# Patient Record
Sex: Male | Born: 1935 | Race: White | Hispanic: No | Marital: Married | Smoking: Never smoker
Health system: Southern US, Community
[De-identification: ages and names within clinical notes are randomized; demographics above are authoritative.]

## PROBLEM LIST (undated history)

## (undated) DIAGNOSIS — D696 Thrombocytopenia, unspecified: Secondary | ICD-10-CM

## (undated) DIAGNOSIS — K219 Gastro-esophageal reflux disease without esophagitis: Secondary | ICD-10-CM

## (undated) DIAGNOSIS — J340 Abscess, furuncle and carbuncle of nose: Secondary | ICD-10-CM

## (undated) DIAGNOSIS — N400 Enlarged prostate without lower urinary tract symptoms: Secondary | ICD-10-CM

## (undated) DIAGNOSIS — E785 Hyperlipidemia, unspecified: Secondary | ICD-10-CM

## (undated) DIAGNOSIS — I10 Essential (primary) hypertension: Secondary | ICD-10-CM

## (undated) HISTORY — DX: Benign prostatic hyperplasia without lower urinary tract symptoms: N40.0

## (undated) HISTORY — DX: Abscess, furuncle and carbuncle of nose: J34.0

## (undated) HISTORY — DX: Hyperlipidemia, unspecified: E78.5

## (undated) HISTORY — DX: Thrombocytopenia, unspecified: D69.6

## (undated) HISTORY — DX: Essential (primary) hypertension: I10

## (undated) HISTORY — DX: Gastro-esophageal reflux disease without esophagitis: K21.9

---

## 2003-09-24 ENCOUNTER — Ambulatory Visit (HOSPITAL_COMMUNITY): Admission: RE | Admit: 2003-09-24 | Discharge: 2003-09-24 | Payer: Self-pay | Admitting: *Deleted

## 2003-09-24 ENCOUNTER — Encounter (INDEPENDENT_AMBULATORY_CARE_PROVIDER_SITE_OTHER): Payer: Self-pay | Admitting: *Deleted

## 2006-12-27 ENCOUNTER — Emergency Department (HOSPITAL_COMMUNITY): Admission: EM | Admit: 2006-12-27 | Discharge: 2006-12-27 | Payer: Self-pay | Admitting: Emergency Medicine

## 2008-01-28 ENCOUNTER — Encounter: Admission: RE | Admit: 2008-01-28 | Discharge: 2008-01-28 | Payer: Self-pay | Admitting: General Surgery

## 2008-01-31 ENCOUNTER — Encounter (HOSPITAL_BASED_OUTPATIENT_CLINIC_OR_DEPARTMENT_OTHER): Payer: Self-pay | Admitting: General Surgery

## 2008-01-31 ENCOUNTER — Ambulatory Visit (HOSPITAL_BASED_OUTPATIENT_CLINIC_OR_DEPARTMENT_OTHER): Admission: RE | Admit: 2008-01-31 | Discharge: 2008-01-31 | Payer: Self-pay | Admitting: General Surgery

## 2011-03-28 NOTE — Op Note (Signed)
Robbins, Jimmy                  ACCOUNT NO.:  [id number]   MEDICAL RECORD NO.:  [id number]          PATIENT TYPE:  AMB   LOCATION:  DSC                          FACILITY:  [facility]   PHYSICIAN:  Leonie Man, M.D.   DATE OF BIRTH:  1921-08-29   DATE OF PROCEDURE:  01/31/2008  DATE OF DISCHARGE:                               OPERATIVE REPORT   PREOPERATIVE DIAGNOSIS:  Basal cell carcinoma of right frontal parietal  scalp.   POSTOPERATIVE DIAGNOSIS:  Basal cell carcinoma of right frontal parietal  scalp.   PROCEDURE:  Excision basal cell carcinoma of right frontal parietal  scalp.   SURGEON:  Leonie Man, M.D.   ASSISTANT:  OR nurse.   ANESTHESIA:  General.   SPECIMENS TO LAB:  Scalp skin with lesion, long suture marking anterior  margin, double suture marking lateral margin.   ESTIMATED BLOOD LOSS:  minimal.   COMPLICATIONS:  None.  The patient returned to the PACU in excellent  condition.   Mr. Mehtaab Mayeda is an 77 year old gentleman who has had multiple basal  cell carcinomas in the past.  He comes in now with a rather large basal  cell carcinoma which was biopsied with close margins.  The tumor  measures 2 cm in diameter.  He comes to the operating room now after the  risks and potential benefits of surgery have been fully discussed.  All  questions answered and consent obtained.   PROCEDURE:  The patient is positioned supinely and the head and scalp  were prepped and draped to be included in a sterile operative field.  Positive identification of the patient as Kahlel Peake and the area to be  removed as right frontal parietal scalp lesion is done.  I infiltrated  the region under the lesion with 1% lidocaine with epinephrine.  I then  made a transversely placed elliptical incision leaving approximately a 2  mm margin around the closest approach of the tumor.  I deepened this  down to the calvarium and removing the entire ellipse of skin with  electrocautery.  The  margins were marked as noted above and hemostasis  was then obtained with electrocautery.  I raised skin flaps anteriorly  toward the brow and posteriorly over the parietal area to effect tension-  free closure, then closed the skin with three retention sutures of 3-0  nylon and then a running suture of 4-0 Monocryl in the dermis.  I  placed Dermabond over this and then steri-stripped the wound in place.  A sterile dressing was applied.  The anesthetic reversed and the patient  removed from the operating room to the recovery room in stable  condition.  He tolerated the procedure well.      Leonie Man, M.D.  Electronically Signed     PB/MEDQ  D:  01/31/2008  T:  01/31/2008  Job:  [id number]

## 2011-08-07 LAB — DIFFERENTIAL
Basophils Absolute: 0
Basophils Relative: 1
Eosinophils Absolute: 0.3
Eosinophils Relative: 5
Lymphocytes Relative: 26
Lymphs Abs: 1.5
Monocytes Absolute: 0.6
Monocytes Relative: 11
Neutro Abs: 3.3
Neutrophils Relative %: 58

## 2011-08-07 LAB — COMPREHENSIVE METABOLIC PANEL
ALT: 31
AST: 28
Albumin: 3.8
Alkaline Phosphatase: 62
BUN: 15
CO2: 24
Calcium: 9.1
Chloride: 106
Creatinine, Ser: 1.18
GFR calc Af Amer: >60
GFR calc non Af Amer: 59 — ABNORMAL LOW
Glucose, Bld: 103 — ABNORMAL HIGH
Potassium: 4
Sodium: 138
Total Bilirubin: 0.9
Total Protein: 6.3

## 2011-08-07 LAB — CBC
HCT: 46.6
Hemoglobin: 15.9
MCHC: 34.2
MCV: 92.6
Platelets: 134 — ABNORMAL LOW
RBC: 5.03
RDW: 14
WBC: 5.7

## 2011-08-21 ENCOUNTER — Encounter: Payer: Self-pay | Admitting: Oncology

## 2011-08-22 ENCOUNTER — Other Ambulatory Visit: Payer: Self-pay | Admitting: Oncology

## 2011-08-22 ENCOUNTER — Encounter (HOSPITAL_BASED_OUTPATIENT_CLINIC_OR_DEPARTMENT_OTHER): Payer: Medicare Other | Admitting: Oncology

## 2011-08-22 DIAGNOSIS — D696 Thrombocytopenia, unspecified: Secondary | ICD-10-CM

## 2011-08-22 DIAGNOSIS — N400 Enlarged prostate without lower urinary tract symptoms: Secondary | ICD-10-CM

## 2011-08-22 DIAGNOSIS — E785 Hyperlipidemia, unspecified: Secondary | ICD-10-CM

## 2011-08-22 LAB — CBC WITH DIFFERENTIAL/PLATELET
BASO%: 0.4 % (ref 0.0–2.0)
Basophils Absolute: 0 10*3/uL (ref 0.0–0.1)
EOS%: 3 % (ref 0.0–7.0)
Eosinophils Absolute: 0.2 10*3/uL (ref 0.0–0.5)
LYMPH%: 24.5 % (ref 14.0–49.0)
MCH: 31.6 pg (ref 27.2–33.4)
MONO%: 9.2 % (ref 0.0–14.0)
NEUT%: 62.9 % (ref 39.0–75.0)
lymph#: 1.6 10*3/uL (ref 0.9–3.3)

## 2011-08-22 LAB — MORPHOLOGY: PLT EST: DECREASED

## 2011-08-24 ENCOUNTER — Encounter: Payer: Self-pay | Admitting: Oncology

## 2011-08-24 DIAGNOSIS — D696 Thrombocytopenia, unspecified: Secondary | ICD-10-CM | POA: Insufficient documentation

## 2011-08-24 LAB — PROTEIN ELECTROPHORESIS, SERUM
Albumin ELP: 62.7 % (ref 55.8–66.1)
Total Protein, Serum Electrophoresis: 7 g/dL (ref 6.0–8.3)

## 2011-08-24 LAB — COMPREHENSIVE METABOLIC PANEL
ALT: 19 U/L (ref 0–53)
Albumin: 4.7 g/dL (ref 3.5–5.2)
CO2: 24 mEq/L (ref 19–32)
Calcium: 9.2 mg/dL (ref 8.4–10.5)
Chloride: 108 mEq/L (ref 96–112)
Glucose, Bld: 81 mg/dL (ref 70–99)
Sodium: 144 mEq/L (ref 135–145)
Total Protein: 7 g/dL (ref 6.0–8.3)

## 2011-08-24 LAB — KAPPA/LAMBDA LIGHT CHAINS
Kappa free light chain: 1.41 mg/dL (ref 0.33–1.94)
Kappa:Lambda Ratio: 1.41 (ref 0.26–1.65)
Lambda Free Lght Chn: 1 mg/dL (ref 0.57–2.63)

## 2011-08-24 LAB — ANA: Anti Nuclear Antibody(ANA): NEGATIVE

## 2011-08-29 ENCOUNTER — Other Ambulatory Visit: Payer: Self-pay | Admitting: Oncology

## 2011-08-29 ENCOUNTER — Encounter (HOSPITAL_BASED_OUTPATIENT_CLINIC_OR_DEPARTMENT_OTHER): Payer: Medicare Other | Admitting: Oncology

## 2011-08-29 ENCOUNTER — Other Ambulatory Visit (HOSPITAL_COMMUNITY)
Admission: RE | Admit: 2011-08-29 | Discharge: 2011-08-29 | Disposition: A | Discharge status: Home / Self Care | Payer: Medicare Other | Source: Ambulatory Visit | Attending: Oncology | Admitting: Oncology

## 2011-08-29 DIAGNOSIS — D696 Thrombocytopenia, unspecified: Secondary | ICD-10-CM | POA: Insufficient documentation

## 2011-09-08 ENCOUNTER — Encounter: Payer: Self-pay | Admitting: *Deleted

## 2011-09-15 ENCOUNTER — Encounter: Payer: Self-pay | Admitting: Oncology

## 2011-09-15 DIAGNOSIS — K219 Gastro-esophageal reflux disease without esophagitis: Secondary | ICD-10-CM | POA: Insufficient documentation

## 2011-09-15 DIAGNOSIS — N400 Enlarged prostate without lower urinary tract symptoms: Secondary | ICD-10-CM | POA: Insufficient documentation

## 2011-09-15 DIAGNOSIS — I10 Essential (primary) hypertension: Secondary | ICD-10-CM | POA: Insufficient documentation

## 2011-09-15 DIAGNOSIS — E785 Hyperlipidemia, unspecified: Secondary | ICD-10-CM | POA: Insufficient documentation

## 2011-09-27 ENCOUNTER — Telehealth: Payer: Self-pay | Admitting: Oncology

## 2011-09-27 ENCOUNTER — Other Ambulatory Visit: Payer: Self-pay | Admitting: Oncology

## 2011-09-27 ENCOUNTER — Other Ambulatory Visit (HOSPITAL_BASED_OUTPATIENT_CLINIC_OR_DEPARTMENT_OTHER): Payer: Medicare Other

## 2011-09-27 ENCOUNTER — Ambulatory Visit (HOSPITAL_BASED_OUTPATIENT_CLINIC_OR_DEPARTMENT_OTHER): Payer: Medicare Other | Admitting: Oncology

## 2011-09-27 DIAGNOSIS — D696 Thrombocytopenia, unspecified: Secondary | ICD-10-CM

## 2011-09-27 DIAGNOSIS — M549 Dorsalgia, unspecified: Secondary | ICD-10-CM

## 2011-09-27 DIAGNOSIS — N400 Enlarged prostate without lower urinary tract symptoms: Secondary | ICD-10-CM

## 2011-09-27 LAB — CBC WITH DIFFERENTIAL/PLATELET
BASO%: 0.6 % (ref 0.0–2.0)
EOS%: 2.3 % (ref 0.0–7.0)
HCT: 44.6 % (ref 38.4–49.9)
LYMPH%: 26.9 % (ref 14.0–49.0)
MCH: 31.3 pg (ref 27.2–33.4)
MCHC: 33.8 g/dL (ref 32.0–36.0)
MCV: 92.6 fL (ref 79.3–98.0)
MONO#: 0.7 10*3/uL (ref 0.1–0.9)
MONO%: 11.3 % (ref 0.0–14.0)
NEUT%: 58.9 % (ref 39.0–75.0)
Platelets: 144 10*3/uL (ref 140–400)
RBC: 4.82 10*6/uL (ref 4.20–5.82)

## 2011-09-27 NOTE — Progress Notes (Signed)
[facility] OFFICE PROGRESS NOTE  CC:  Juluis Rainier, M.D.  DIAGNOSIS:  Intermittent thrombocytopenia.  CURRENT THERAPY: watchful observation.  INTERVAL HISTORY: Jimmy Robbins 77 y.o. male returns for routine follow up.  Dr. Bailey Mech feeling well. He has chronic low back pain that has been on for many years. The back pain is worse in the morning when he first wakes up and improves as the day goes on. He denies any bowel or bladder incontinence, lower extremity weakness, or paresthesia. He denies any bleeding symptoms petechiae skin rash B. symptoms weight loss, anorexia, shortness of breath, bleeding symptom.  He still is able to drive and is very active with activities of daily living.   MEDICAL HISTORY: Past Medical History  Diagnosis Date  . Thrombocytopenia   . BPH (benign prostatic hyperplasia)   . HTN (hypertension)   . GERD (gastroesophageal reflux disease)   . Hyperlipidemia     ALLERGIES:   has no known allergies.  MEDICATIONS: Current Outpatient Prescriptions  Medication Sig Dispense Refill  . amLODipine (NORVASC) 5 MG tablet Take 5 mg by mouth daily.        . B Complex-C (B-COMPLEX WITH VITAMIN C) tablet Take 1 tablet by mouth daily.        . finasteride (PROSCAR) 5 MG tablet Take 5 mg by mouth daily.        . folic acid (FOLVITE) 1 MG tablet Take 1 mg by mouth daily.        Marland Kitchen lovastatin (MEVACOR) 20 MG tablet Take 20 mg by mouth at bedtime.        . Multiple Vitamin (MULTIVITAMIN) tablet Take 1 tablet by mouth daily.        . multivitamin-lutein (OCUVITE-LUTEIN) CAPS Take 1 capsule by mouth daily.        Marland Kitchen omeprazole (PRILOSEC) 20 MG capsule Take 20 mg by mouth daily.        . Tamsulosin HCl (FLOMAX) 0.4 MG CAPS Take 0.4 mg by mouth daily.          SURGICAL HISTORY: No past surgical history on file.  REVIEW OF SYSTEMS:  Pertinent items are noted in HPI.   Filed Vitals:   09/27/11 1157  BP: 160/79  Pulse: 82  Temp: 97.1 F (36.2 C)   Wt  Readings from Last 3 Encounters:  09/27/11 171 lb 11.2 oz (77.883 kg)  08/22/11 174 lb 4.8 oz (79.062 kg)    PHYSICAL EXAMINATION:  General:  well-nourished in no acute distress.  Eyes:  no scleral icterus.  ENT:  There were no oropharyngeal lesions.  Neck was without thyromegaly.  Lymphatics:  Negative cervical, supraclavicular or axillary adenopathy.  Respiratory: lungs were clear bilaterally without wheezing or crackles.  Cardiovascular:  Regular rate and rhythm, S1/S2, without murmur, rub or gallop.  There was no pedal edema.  GI:  abdomen was soft, flat, nontender, nondistended, without organomegaly.  Muscoloskeletal:  no spinal tenderness of palpation of vertebral spine.  Skin exam was without echymosis, petichae.  Neuro exam was nonfocal.  Patient needed minimal help to get on and off exam table due to his chronic lower back pain.  Gait was normal.  Patient was alerted and oriented.  Attention was good.   Language was appropriate.  Mood was normal without depression.  Speech was not pressured.  Thought content was not tangential.    LABORATORY/RADIOLOGY DATA: WBC 6.1; ANC 3.6; Hgb 15.1; Plt 144.  Bone marrow aspirate and core biopsy on 08/29/2011 showed:  Bone Marrow, Aspirate,Biopsy, and Clot, left iliac crest :   - HYPERCELLULAR BONE MARROW FOR AGE WITH TRILINEAGE HEMATOPOIESIS INCLUDING   ABUNDANT   MEGAKARYOCYTES.   - SEE COMMENT.   PERIPHERAL BLOOD:   - THROMBOCYTOPENIA.  CYTOGENTICS:  Normal   ASSESSMENT AND PLAN:   1. Hyperlipidemia.   on lovastatin per PCP.  2. Benign prostatic hypertrophy.  He is on finasteride. 3. Intermittent thrombocytopenia: This is resolved today. His bone marrow biopsy including cytogenics were negative for myeloproliferative disease or lymphoproliferative disease or MDS.  Thus, he either had mild ITP or medication-induced thrombocytopenia.   4. Follow up:  Lab only with the [facility] in Feb 2013, May 2013.  I'll see him in August 2013.

## 2011-09-27 NOTE — Telephone Encounter (Signed)
gve the pt his feb,may,aug 2012 appt calendar

## 2011-10-14 ENCOUNTER — Other Ambulatory Visit: Payer: Self-pay | Admitting: Oncology

## 2011-10-16 ENCOUNTER — Telehealth: Payer: Self-pay | Admitting: Oncology

## 2011-10-16 ENCOUNTER — Other Ambulatory Visit: Payer: Medicare Other | Admitting: Lab

## 2011-10-16 NOTE — Telephone Encounter (Signed)
lmonvm advising the pt that his appts for 07/10/2012 has been r/s to 06/17/2012 due to the md's schedule

## 2011-10-28 ENCOUNTER — Ambulatory Visit: Payer: Medicare Other

## 2011-12-25 ENCOUNTER — Other Ambulatory Visit (HOSPITAL_BASED_OUTPATIENT_CLINIC_OR_DEPARTMENT_OTHER): Payer: Medicare Other | Admitting: Lab

## 2011-12-25 ENCOUNTER — Telehealth: Payer: Self-pay | Admitting: *Deleted

## 2011-12-25 ENCOUNTER — Other Ambulatory Visit: Payer: Self-pay | Admitting: *Deleted

## 2011-12-25 DIAGNOSIS — D696 Thrombocytopenia, unspecified: Secondary | ICD-10-CM

## 2011-12-25 LAB — CBC WITH DIFFERENTIAL/PLATELET
BASO%: 0.4 % (ref 0.0–2.0)
EOS%: 3.4 % (ref 0.0–7.0)
HCT: 43.7 % (ref 38.4–49.9)
MCH: 31.1 pg (ref 27.2–33.4)
MCHC: 33.9 g/dL (ref 32.0–36.0)
MONO#: 0.7 10*3/uL (ref 0.1–0.9)
NEUT%: 57.6 % (ref 39.0–75.0)
RBC: 4.76 10*6/uL (ref 4.20–5.82)
WBC: 6.1 10*3/uL (ref 4.0–10.3)
lymph#: 1.7 10*3/uL (ref 0.9–3.3)

## 2011-12-25 NOTE — Telephone Encounter (Signed)
Called pt w/ results of his Platelets and relayed Dr. Lodema Pilot message that they are stable and to continue observation.  Pt verbalized understanding and states next lab appt in May.

## 2011-12-25 NOTE — Telephone Encounter (Signed)
Message copied by Wende Mott on Mon Dec 25, 2011 11:23 AM ------      Message from: HA, Raliegh Ip T      Created: Mon Dec 25, 2011  9:58 AM       Please call pt.  His plt is low but stable.  Continue observation.  Thanks.

## 2012-03-25 ENCOUNTER — Other Ambulatory Visit (HOSPITAL_BASED_OUTPATIENT_CLINIC_OR_DEPARTMENT_OTHER): Payer: Medicare Other | Admitting: Lab

## 2012-03-25 ENCOUNTER — Telehealth: Payer: Self-pay | Admitting: *Deleted

## 2012-03-25 DIAGNOSIS — D696 Thrombocytopenia, unspecified: Secondary | ICD-10-CM

## 2012-03-25 LAB — CBC WITH DIFFERENTIAL/PLATELET
BASO%: 0.7 % (ref 0.0–2.0)
Basophils Absolute: 0 10*3/uL (ref 0.0–0.1)
EOS%: 3.1 % (ref 0.0–7.0)
HGB: 14.8 g/dL (ref 13.0–17.1)
MCH: 31.4 pg (ref 27.2–33.4)
MCHC: 33.9 g/dL (ref 32.0–36.0)
MONO#: 0.8 10*3/uL (ref 0.1–0.9)
RDW: 14 % (ref 11.0–14.6)
WBC: 6.2 10*3/uL (ref 4.0–10.3)
lymph#: 1.7 10*3/uL (ref 0.9–3.3)

## 2012-03-25 NOTE — Telephone Encounter (Signed)
Message copied by Wende Mott on Mon Mar 25, 2012 12:04 PM ------      Message from: HA, Raliegh Ip T      Created: Mon Mar 25, 2012 10:33 AM       Please call pt.  His thrombocytopenia is stable.  Continue observation.  Thanks.

## 2012-03-25 NOTE — Telephone Encounter (Signed)
Called pt w/ results of Platelet count and they are stable per Dr. Gaylyn Rong.  Keep next appt as scheduled in August.  Pt verbalized understanding and states he "is happy w/ the results. "

## 2012-06-17 ENCOUNTER — Ambulatory Visit: Payer: Medicare Other | Admitting: Oncology

## 2012-06-17 ENCOUNTER — Other Ambulatory Visit: Payer: Medicare Other | Admitting: Lab

## 2012-06-26 ENCOUNTER — Telehealth: Payer: Self-pay | Admitting: Oncology

## 2012-06-26 ENCOUNTER — Ambulatory Visit (HOSPITAL_BASED_OUTPATIENT_CLINIC_OR_DEPARTMENT_OTHER): Payer: Medicare Other | Admitting: Oncology

## 2012-06-26 ENCOUNTER — Other Ambulatory Visit (HOSPITAL_BASED_OUTPATIENT_CLINIC_OR_DEPARTMENT_OTHER): Payer: Medicare Other

## 2012-06-26 ENCOUNTER — Encounter: Payer: Self-pay | Admitting: Oncology

## 2012-06-26 VITALS — BP 145/72 | HR 68 | Temp 96.7°F | Resp 20 | Ht 64.5 in | Wt 176.1 lb

## 2012-06-26 DIAGNOSIS — I10 Essential (primary) hypertension: Secondary | ICD-10-CM

## 2012-06-26 DIAGNOSIS — N400 Enlarged prostate without lower urinary tract symptoms: Secondary | ICD-10-CM

## 2012-06-26 DIAGNOSIS — J3489 Other specified disorders of nose and nasal sinuses: Secondary | ICD-10-CM

## 2012-06-26 DIAGNOSIS — D696 Thrombocytopenia, unspecified: Secondary | ICD-10-CM

## 2012-06-26 DIAGNOSIS — J340 Abscess, furuncle and carbuncle of nose: Secondary | ICD-10-CM | POA: Insufficient documentation

## 2012-06-26 HISTORY — DX: Abscess, furuncle and carbuncle of nose: J34.0

## 2012-06-26 LAB — CBC WITH DIFFERENTIAL/PLATELET
BASO%: 0.8 % (ref 0.0–2.0)
Eosinophils Absolute: 0.2 10*3/uL (ref 0.0–0.5)
HCT: 44.5 % (ref 38.4–49.9)
LYMPH%: 27.1 % (ref 14.0–49.0)
MCHC: 33.4 g/dL (ref 32.0–36.0)
MONO#: 0.8 10*3/uL (ref 0.1–0.9)
NEUT#: 3.5 10*3/uL (ref 1.5–6.5)
NEUT%: 55.6 % (ref 39.0–75.0)
Platelets: 98 10*3/uL — ABNORMAL LOW (ref 140–400)
RBC: 4.77 10*6/uL (ref 4.20–5.82)
WBC: 6.4 10*3/uL (ref 4.0–10.3)
lymph#: 1.7 10*3/uL (ref 0.9–3.3)

## 2012-06-26 LAB — COMPREHENSIVE METABOLIC PANEL
ALT: 16 U/L (ref 0–53)
CO2: 25 mEq/L (ref 19–32)
Calcium: 9 mg/dL (ref 8.4–10.5)
Chloride: 105 mEq/L (ref 96–112)
Glucose, Bld: 91 mg/dL (ref 70–99)
Sodium: 137 mEq/L (ref 135–145)
Total Bilirubin: 0.8 mg/dL (ref 0.3–1.2)
Total Protein: 6.4 g/dL (ref 6.0–8.3)

## 2012-06-26 NOTE — Telephone Encounter (Signed)
gv pt appt schedule for December 2013 and April/August 2014. Pt also given appt for 10/16 @ 11 am w/Dr. Sharyn Lull.

## 2012-06-26 NOTE — Patient Instructions (Addendum)
1.  Diagnosis:  Mild chronic thrombocytopenia.   2.  Cause:  Unknown; negative work up so far including bone marrow biopsy.  3.  Treatment:  observation since chance of spontaneous bleeding is low when platelet count is more than 30.  4.  Follow up:  Lab-only appointment at the [facility] in about 4 and then 8 months.  Return visit in about 1 year.

## 2012-07-02 NOTE — Progress Notes (Signed)
[facility]  Telephone:([phone] Fax:([phone]) [phone]   OFFICE PROGRESS NOTE   Cc:  Gaye Alken, MD   DIAGNOSIS: Intermittent thrombocytopenia; most likely from medication or chronic mild ITP.  Bone marrow biopsy in 08/2011 was negative for MDS.   CURRENT THERAPY: watchful observation.  INTERVAL HISTORY: Jimmy Robbins 78 y.o. male returns for regular follow up. He has bilateral knee pain from what he thought was due to OA.  He takes over the counter pain meds with improvement.  Pain and stiffness are worst in the morning and improved with activities.  He also has a nonhealing ulcer on the tip of his nose for the last few months.  He denied any obvious skin rash elsewhere.  He has mild fatigue; however, he is still independent of activities of daily living and was able to drive to clinic today.   Patient denies fever, anorexia, weight loss, headache, visual changes, confusion, drenching night sweats, palpable lymph node swelling, mucositis, odynophagia, dysphagia, nausea vomiting, jaundice, chest pain, palpitation, shortness of breath, dyspnea on exertion, productive cough, gum bleeding, epistaxis, hematemesis, hemoptysis, abdominal pain, abdominal swelling, early satiety, melena, hematochezia, hematuria, spontaneous bleeding, joint swelling, heat or cold intolerance, bowel bladder incontinence, back pain, focal motor weakness, paresthesia, depression, suicidal or homicidal ideation, feeling hopelessness.   Past Medical History  Diagnosis Date  . Thrombocytopenia   . BPH (benign prostatic hyperplasia)   . HTN (hypertension)   . GERD (gastroesophageal reflux disease)   . Hyperlipidemia   . Nose ulceration 06/26/2012    No past surgical history on file.  Current Outpatient Prescriptions  Medication Sig Dispense Refill  . amLODipine (NORVASC) 5 MG tablet Take 5 mg by mouth daily.        . B Complex-C (B-COMPLEX WITH VITAMIN C) tablet Take 1 tablet by mouth  daily.        . finasteride (PROSCAR) 5 MG tablet Take 5 mg by mouth daily.        . folic acid (FOLVITE) 1 MG tablet Take 1 mg by mouth daily.        Marland Kitchen lovastatin (MEVACOR) 20 MG tablet Take 20 mg by mouth at bedtime.        . Multiple Vitamin (MULTIVITAMIN) tablet Take 1 tablet by mouth daily.        . multivitamin-lutein (OCUVITE-LUTEIN) CAPS Take 1 capsule by mouth daily.        Marland Kitchen omeprazole (PRILOSEC) 20 MG capsule Take 20 mg by mouth daily.        . Tamsulosin HCl (FLOMAX) 0.4 MG CAPS Take 0.4 mg by mouth daily.          ALLERGIES:   has no known allergies.  REVIEW OF SYSTEMS:  The rest of the 14-point review of system was negative.   Filed Vitals:   06/26/12 1111  BP: 145/72  Pulse: 68  Temp: 96.7 F (35.9 C)  Resp: 20   Wt Readings from Last 3 Encounters:  06/26/12 176 lb 1.6 oz (79.878 kg)  09/27/11 171 lb 11.2 oz (77.883 kg)  08/22/11 174 lb 4.8 oz (79.062 kg)   ECOG Performance status: 1  PHYSICAL EXAMINATION:   General:  well-nourished man in no acute distress.  Eyes:  no scleral icterus.  ENT:  There were no oropharyngeal lesions.  There as a 0.5cm ulceration at the tip of his nose without discoloration, purulent discharge.  Neck was without thyromegaly.  Lymphatics:  Negative cervical, supraclavicular or axillary adenopathy.  Respiratory: lungs were clear bilaterally without wheezing or crackles.  Cardiovascular:  Regular rate and rhythm, S1/S2, without murmur, rub or gallop.  There was no pedal edema.  GI:  abdomen was soft, flat, nontender, nondistended, without organomegaly.  Muscoloskeletal:  no spinal tenderness of palpation of vertebral spine.  Neuro exam was nonfocal.  Patient was able to get on and off exam table without assistance.  Gait was normal.  Patient was alerted and oriented.  Attention was good.   Language was appropriate.  Mood was normal without depression.  Speech was not pressured.  Thought content was not tangential.     LABORATORY/RADIOLOGY  DATA:  Lab Results  Component Value Date   WBC 6.4 06/26/2012   HGB 14.9 06/26/2012   HCT 44.5 06/26/2012   PLT 98* 06/26/2012   GLUCOSE 91 06/26/2012   ALKPHOS 67 06/26/2012   ALT 16 06/26/2012   AST 18 06/26/2012   NA 137 06/26/2012   K 4.3 06/26/2012   CL 105 06/26/2012   CREATININE 1.07 06/26/2012   BUN 19 06/26/2012   CO2 25 06/26/2012     ASSESSMENT AND PLAN:   1. Hyperlipidemia. on lovastatin per PCP.  2. Benign prostatic hypertrophy. He is on finasteride and tamsulosin per PCP. 3. Intermittent thrombocytopenia:  His bone marrow biopsy including cytogenics were negative for myeloproliferative disease or lymphoproliferative disease or MDS. Thus, he either had mild ITP or medication-induced thrombocytopenia.  There is low risk of spontaneous bleeding with platelet >30.  [name] expressed informed understanding and agreed with watchful observation.  4. Nose ulceration:  Need to rule out basal cell carcinoma.  I referred him to a local Dermatologist.  5. Follow up:  Lab-only appointment at the [facility] in about 4 and then 8 months.  Return visit in about 1 year.    The length of time of the face-to-face encounter was 15  minutes. More than 50% of time was spent counseling and coordination of care.

## 2012-07-10 ENCOUNTER — Ambulatory Visit: Payer: Medicare Other | Admitting: Oncology

## 2012-07-10 ENCOUNTER — Other Ambulatory Visit: Payer: Medicare Other | Admitting: Lab

## 2012-10-23 ENCOUNTER — Other Ambulatory Visit: Payer: Medicare Other | Admitting: Lab

## 2012-10-30 ENCOUNTER — Other Ambulatory Visit (HOSPITAL_BASED_OUTPATIENT_CLINIC_OR_DEPARTMENT_OTHER): Payer: Medicare Other

## 2012-10-30 ENCOUNTER — Telehealth: Payer: Self-pay

## 2012-10-30 DIAGNOSIS — J340 Abscess, furuncle and carbuncle of nose: Secondary | ICD-10-CM

## 2012-10-30 DIAGNOSIS — I10 Essential (primary) hypertension: Secondary | ICD-10-CM

## 2012-10-30 DIAGNOSIS — J3489 Other specified disorders of nose and nasal sinuses: Secondary | ICD-10-CM

## 2012-10-30 DIAGNOSIS — D696 Thrombocytopenia, unspecified: Secondary | ICD-10-CM

## 2012-10-30 LAB — CBC WITH DIFFERENTIAL/PLATELET
BASO%: 1 % (ref 0.0–2.0)
Eosinophils Absolute: 0.2 10*3/uL (ref 0.0–0.5)
MCHC: 34 g/dL (ref 32.0–36.0)
MONO#: 0.7 10*3/uL (ref 0.1–0.9)
NEUT#: 3.1 10*3/uL (ref 1.5–6.5)
RBC: 4.74 10*6/uL (ref 4.20–5.82)
RDW: 14.1 % (ref 11.0–14.6)
WBC: 5.6 10*3/uL (ref 4.0–10.3)
lymph#: 1.6 10*3/uL (ref 0.9–3.3)

## 2012-10-30 NOTE — Telephone Encounter (Signed)
Message copied by Kallie Locks on Wed Oct 30, 2012  4:03 PM ------      Message from: HA, Raliegh Ip T      Created: Wed Oct 30, 2012  9:54 AM       Please call pt.  Please inform him that his thrombocytopenia is stable.  Continue observation.  Thanks.

## 2013-02-19 ENCOUNTER — Other Ambulatory Visit (HOSPITAL_BASED_OUTPATIENT_CLINIC_OR_DEPARTMENT_OTHER): Payer: Medicare Other | Admitting: Lab

## 2013-02-19 ENCOUNTER — Encounter: Payer: Self-pay | Admitting: Oncology

## 2013-02-19 DIAGNOSIS — D696 Thrombocytopenia, unspecified: Secondary | ICD-10-CM

## 2013-02-19 DIAGNOSIS — J340 Abscess, furuncle and carbuncle of nose: Secondary | ICD-10-CM

## 2013-02-19 DIAGNOSIS — I10 Essential (primary) hypertension: Secondary | ICD-10-CM

## 2013-02-19 LAB — CBC WITH DIFFERENTIAL/PLATELET
BASO%: 0.6 % (ref 0.0–2.0)
EOS%: 3.2 % (ref 0.0–7.0)
HCT: 44.6 % (ref 38.4–49.9)
LYMPH%: 25.4 % (ref 14.0–49.0)
MCH: 30.8 pg (ref 27.2–33.4)
MCHC: 34.1 g/dL (ref 32.0–36.0)
MONO%: 12.2 % (ref 0.0–14.0)
NEUT%: 58.6 % (ref 39.0–75.0)
Platelets: 83 10*3/uL — ABNORMAL LOW (ref 140–400)
RBC: 4.94 10*6/uL (ref 4.20–5.82)
WBC: 6.6 10*3/uL (ref 4.0–10.3)

## 2013-06-25 ENCOUNTER — Encounter: Payer: Self-pay | Admitting: Oncology

## 2013-06-25 ENCOUNTER — Ambulatory Visit (HOSPITAL_BASED_OUTPATIENT_CLINIC_OR_DEPARTMENT_OTHER): Payer: Medicare Other | Admitting: Oncology

## 2013-06-25 ENCOUNTER — Other Ambulatory Visit (HOSPITAL_BASED_OUTPATIENT_CLINIC_OR_DEPARTMENT_OTHER): Payer: Medicare Other | Admitting: Lab

## 2013-06-25 ENCOUNTER — Telehealth: Payer: Self-pay | Admitting: Oncology

## 2013-06-25 VITALS — BP 160/79 | HR 71 | Temp 97.0°F | Resp 18 | Ht 64.0 in | Wt 174.0 lb

## 2013-06-25 DIAGNOSIS — J340 Abscess, furuncle and carbuncle of nose: Secondary | ICD-10-CM

## 2013-06-25 DIAGNOSIS — D696 Thrombocytopenia, unspecified: Secondary | ICD-10-CM

## 2013-06-25 DIAGNOSIS — E785 Hyperlipidemia, unspecified: Secondary | ICD-10-CM

## 2013-06-25 DIAGNOSIS — I10 Essential (primary) hypertension: Secondary | ICD-10-CM

## 2013-06-25 LAB — CBC WITH DIFFERENTIAL/PLATELET
BASO%: 0.5 % (ref 0.0–2.0)
EOS%: 2.2 % (ref 0.0–7.0)
MCH: 31.3 pg (ref 27.2–33.4)
MCHC: 34.1 g/dL (ref 32.0–36.0)
MONO#: 0.8 10*3/uL (ref 0.1–0.9)
NEUT%: 61.6 % (ref 39.0–75.0)
RBC: 4.96 10*6/uL (ref 4.20–5.82)
RDW: 14 % (ref 11.0–14.6)
WBC: 6.4 10*3/uL (ref 4.0–10.3)
lymph#: 1.5 10*3/uL (ref 0.9–3.3)

## 2013-06-25 LAB — COMPREHENSIVE METABOLIC PANEL (CC13)
AST: 20 U/L (ref 5–34)
BUN: 15.4 mg/dL (ref 7.0–26.0)
Calcium: 9.1 mg/dL (ref 8.4–10.4)
Chloride: 107 mEq/L (ref 98–109)
Creatinine: 1.1 mg/dL (ref 0.7–1.3)
Total Bilirubin: 0.84 mg/dL (ref 0.20–1.20)

## 2013-06-25 LAB — MORPHOLOGY: PLT EST: DECREASED

## 2013-06-25 NOTE — Progress Notes (Signed)
[facility]  Telephone:(336) [phone] Fax:(336) [phone]   OFFICE PROGRESS NOTE   Cc:  Gaye Alken, MD   DIAGNOSIS: Intermittent thrombocytopenia; most likely from medication or chronic mild ITP.  Bone marrow biopsy in 08/2011 was negative for MDS.   CURRENT THERAPY: watchful observation.  INTERVAL HISTORY: Jimmy Robbins 79 y.o. male returns for regular follow up. He has bilateral knee pain from what he thought was due to OA.  He takes over the counter pain meds with improvement.  Pain and stiffness are worst in the morning and improved with activities.  He has mild fatigue; however, he is still independent of activities of daily living and was able to drive to clinic today.   Patient denies fever, anorexia, weight loss, headache, visual changes, confusion, drenching night sweats, palpable lymph node swelling, mucositis, odynophagia, dysphagia, nausea vomiting, jaundice, chest pain, palpitation, shortness of breath, dyspnea on exertion, productive cough, gum bleeding, epistaxis, hematemesis, hemoptysis, abdominal pain, abdominal swelling, early satiety, melena, hematochezia, hematuria, spontaneous bleeding, joint swelling, heat or cold intolerance, bowel bladder incontinence, back pain, focal motor weakness, paresthesia, depression, suicidal or homicidal ideation, feeling hopelessness.   Past Medical History  Diagnosis Date  . Thrombocytopenia   . BPH (benign prostatic hyperplasia)   . HTN (hypertension)   . GERD (gastroesophageal reflux disease)   . Hyperlipidemia   . Nose ulceration 06/26/2012    History reviewed. No pertinent past surgical history.  Current Outpatient Prescriptions  Medication Sig Dispense Refill  . amLODipine (NORVASC) 5 MG tablet Take 5 mg by mouth daily.        . B Complex-C (B-COMPLEX WITH VITAMIN C) tablet Take 1 tablet by mouth daily.        . finasteride (PROSCAR) 5 MG tablet Take 5 mg by mouth daily.        . folic acid  (FOLVITE) 1 MG tablet Take 1 mg by mouth daily.        Marland Kitchen lovastatin (MEVACOR) 20 MG tablet Take 20 mg by mouth at bedtime.        . Multiple Vitamin (MULTIVITAMIN) tablet Take 1 tablet by mouth daily.        . multivitamin-lutein (OCUVITE-LUTEIN) CAPS Take 1 capsule by mouth daily.        Marland Kitchen omeprazole (PRILOSEC) 20 MG capsule Take 20 mg by mouth daily.        . Tamsulosin HCl (FLOMAX) 0.4 MG CAPS Take 0.4 mg by mouth daily.         No current facility-administered medications for this visit.    ALLERGIES:  has No Known Allergies.  REVIEW OF SYSTEMS:  The rest of the 14-point review of system was negative.   Filed Vitals:   06/25/13 1113  BP: 160/79  Pulse: 71  Temp: 97 F (36.1 C)  Resp: 18   Wt Readings from Last 3 Encounters:  06/25/13 174 lb (78.926 kg)  06/26/12 176 lb 1.6 oz (79.878 kg)  09/27/11 171 lb 11.2 oz (77.883 kg)   ECOG Performance status: 1  PHYSICAL EXAMINATION:   General:  well-nourished man in no acute distress.  Eyes:  no scleral icterus.  ENT:  There were no oropharyngeal lesions.  Neck was without thyromegaly.  Lymphatics:  Negative cervical, supraclavicular or axillary adenopathy.  Respiratory: lungs were clear bilaterally without wheezing or crackles.  Cardiovascular:  Regular rate and rhythm, S1/S2, without murmur, rub or gallop.  There was no pedal edema.  GI:  abdomen was soft, flat,  nontender, nondistended, without organomegaly.  Muscoloskeletal:  no spinal tenderness of palpation of vertebral spine.  Neuro exam was nonfocal.  Patient was able to get on and off exam table without assistance.  Gait was normal.  Patient was alerted and oriented.  Attention was good.   Language was appropriate.  Mood was normal without depression.  Speech was not pressured.  Thought content was not tangential.     LABORATORY/RADIOLOGY DATA:  Lab Results  Component Value Date   WBC 6.4 06/25/2013   HGB 15.5 06/25/2013   HCT 45.6 06/25/2013   PLT 95* 06/25/2013   GLUCOSE  92 06/25/2013   ALKPHOS 75 06/25/2013   ALT 16 06/25/2013   AST 20 06/25/2013   NA 140 06/25/2013   K 4.4 06/25/2013   CL 105 06/26/2012   CREATININE 1.1 06/25/2013   BUN 15.4 06/25/2013   CO2 23 06/25/2013     ASSESSMENT AND PLAN:   1. Hyperlipidemia. on lovastatin per PCP.  2. Benign prostatic hypertrophy. He is on finasteride and tamsulosin per PCP. 3. Intermittent thrombocytopenia:  His bone marrow biopsy including cytogenics were negative for myeloproliferative disease or lymphoproliferative disease or MDS. Thus, he either had mild ITP or medication-induced thrombocytopenia.  There is low risk of spontaneous bleeding with platelet >30.  Mr. Clerk expressed informed understanding and agreed with watchful observation.   4. Follow up:  Lab-only appointment at the [facility] in about 4 and then 8 months.  Return visit in about 1 year.    The length of time of the face-to-face encounter was 15  minutes. More than 50% of time was spent counseling and coordination of care.

## 2013-06-25 NOTE — Telephone Encounter (Signed)
gv and printed appt sched and avs for pt  °

## 2013-10-15 ENCOUNTER — Other Ambulatory Visit: Payer: Self-pay | Admitting: Hematology and Oncology

## 2013-10-15 ENCOUNTER — Telehealth: Payer: Self-pay | Admitting: Hematology and Oncology

## 2013-10-15 ENCOUNTER — Other Ambulatory Visit (HOSPITAL_BASED_OUTPATIENT_CLINIC_OR_DEPARTMENT_OTHER): Payer: Medicare Other | Admitting: Lab

## 2013-10-15 ENCOUNTER — Telehealth: Payer: Self-pay | Admitting: *Deleted

## 2013-10-15 DIAGNOSIS — D696 Thrombocytopenia, unspecified: Secondary | ICD-10-CM

## 2013-10-15 LAB — COMPREHENSIVE METABOLIC PANEL (CC13)
ALT: 15 U/L (ref 0–55)
AST: 19 U/L (ref 5–34)
Albumin: 4.2 g/dL (ref 3.5–5.0)
Alkaline Phosphatase: 71 U/L (ref 40–150)
Anion Gap: 10 mEq/L (ref 3–11)
Calcium: 9.5 mg/dL (ref 8.4–10.4)
Chloride: 108 mEq/L (ref 98–109)
Creatinine: 1.1 mg/dL (ref 0.7–1.3)
Potassium: 4.6 mEq/L (ref 3.5–5.1)
Sodium: 141 mEq/L (ref 136–145)
Total Protein: 6.9 g/dL (ref 6.4–8.3)

## 2013-10-15 LAB — CBC WITH DIFFERENTIAL/PLATELET
BASO%: 1 % (ref 0.0–2.0)
Basophils Absolute: 0.1 10*3/uL (ref 0.0–0.1)
EOS%: 3.1 % (ref 0.0–7.0)
HGB: 15.5 g/dL (ref 13.0–17.1)
LYMPH%: 30.3 % (ref 14.0–49.0)
MCH: 31.1 pg (ref 27.2–33.4)
MCHC: 33 g/dL (ref 32.0–36.0)
MCV: 94.1 fL (ref 79.3–98.0)
MONO%: 12.5 % (ref 0.0–14.0)
NEUT#: 3 10*3/uL (ref 1.5–6.5)
NEUT%: 53.1 % (ref 39.0–75.0)
Platelets: 88 10*3/uL — ABNORMAL LOW (ref 140–400)
RBC: 4.99 10*6/uL (ref 4.20–5.82)
RDW: 14 % (ref 11.0–14.6)
lymph#: 1.7 10*3/uL (ref 0.9–3.3)

## 2013-10-15 NOTE — Telephone Encounter (Signed)
I reordered them under my name For some reason, I cannot cancel lab for April 2015 Keep appt and lab for August 2015

## 2013-10-15 NOTE — Telephone Encounter (Signed)
pt called to request a print out of his schedule when he comes in today. moved 06/17/14 appt to NG. pt will pick new schedule up today

## 2013-10-15 NOTE — Telephone Encounter (Signed)
Pt called to confirm his lab appt for today and his appts for 2015.

## 2014-02-11 ENCOUNTER — Other Ambulatory Visit (HOSPITAL_BASED_OUTPATIENT_CLINIC_OR_DEPARTMENT_OTHER): Payer: Medicare Other

## 2014-02-11 DIAGNOSIS — E785 Hyperlipidemia, unspecified: Secondary | ICD-10-CM

## 2014-02-11 DIAGNOSIS — D696 Thrombocytopenia, unspecified: Secondary | ICD-10-CM

## 2014-02-11 LAB — CBC WITH DIFFERENTIAL/PLATELET
BASO%: 0.7 % (ref 0.0–2.0)
Basophils Absolute: 0 10*3/uL (ref 0.0–0.1)
EOS%: 2.5 % (ref 0.0–7.0)
Eosinophils Absolute: 0.2 10*3/uL (ref 0.0–0.5)
HEMATOCRIT: 44.2 % (ref 38.4–49.9)
HEMOGLOBIN: 14.8 g/dL (ref 13.0–17.1)
LYMPH#: 1.5 10*3/uL (ref 0.9–3.3)
LYMPH%: 24.9 % (ref 14.0–49.0)
MCH: 31.1 pg (ref 27.2–33.4)
MCHC: 33.4 g/dL (ref 32.0–36.0)
MCV: 93.4 fL (ref 79.3–98.0)
MONO#: 0.7 10*3/uL (ref 0.1–0.9)
MONO%: 11.7 % (ref 0.0–14.0)
NEUT%: 60.2 % (ref 39.0–75.0)
NEUTROS ABS: 3.6 10*3/uL (ref 1.5–6.5)
Platelets: 86 10*3/uL — ABNORMAL LOW (ref 140–400)
RBC: 4.74 10*6/uL (ref 4.20–5.82)
RDW: 13.7 % (ref 11.0–14.6)
WBC: 5.9 10*3/uL (ref 4.0–10.3)

## 2014-02-11 LAB — COMPREHENSIVE METABOLIC PANEL (CC13)
ALT: 14 U/L (ref 0–55)
ANION GAP: 9 meq/L (ref 3–11)
AST: 17 U/L (ref 5–34)
Albumin: 4 g/dL (ref 3.5–5.0)
Alkaline Phosphatase: 66 U/L (ref 40–150)
BUN: 16.8 mg/dL (ref 7.0–26.0)
CALCIUM: 8.9 mg/dL (ref 8.4–10.4)
CHLORIDE: 110 meq/L — AB (ref 98–109)
CO2: 22 meq/L (ref 22–29)
CREATININE: 1 mg/dL (ref 0.7–1.3)
Glucose: 88 mg/dl (ref 70–140)
Potassium: 4.4 mEq/L (ref 3.5–5.1)
Sodium: 141 mEq/L (ref 136–145)
TOTAL PROTEIN: 6.3 g/dL — AB (ref 6.4–8.3)
Total Bilirubin: 0.91 mg/dL (ref 0.20–1.20)

## 2014-06-09 ENCOUNTER — Telehealth: Payer: Self-pay | Admitting: Hematology and Oncology

## 2014-06-09 NOTE — Telephone Encounter (Signed)
appt for 8/5 moved to AM due to call. lmonvm for pt and mailed schedule.

## 2014-06-17 ENCOUNTER — Ambulatory Visit (HOSPITAL_BASED_OUTPATIENT_CLINIC_OR_DEPARTMENT_OTHER): Payer: Medicare Other | Admitting: Hematology and Oncology

## 2014-06-17 ENCOUNTER — Encounter: Payer: Self-pay | Admitting: Hematology and Oncology

## 2014-06-17 ENCOUNTER — Other Ambulatory Visit (HOSPITAL_BASED_OUTPATIENT_CLINIC_OR_DEPARTMENT_OTHER): Payer: Medicare Other

## 2014-06-17 VITALS — BP 130/69 | HR 63 | Temp 97.6°F | Resp 18 | Ht 64.0 in | Wt 169.0 lb

## 2014-06-17 DIAGNOSIS — D696 Thrombocytopenia, unspecified: Secondary | ICD-10-CM

## 2014-06-17 LAB — CBC WITH DIFFERENTIAL/PLATELET
BASO%: 0.8 % (ref 0.0–2.0)
Basophils Absolute: 0 10*3/uL (ref 0.0–0.1)
EOS%: 2.3 % (ref 0.0–7.0)
Eosinophils Absolute: 0.1 10*3/uL (ref 0.0–0.5)
HCT: 44.8 % (ref 38.4–49.9)
HEMOGLOBIN: 14.9 g/dL (ref 13.0–17.1)
LYMPH#: 1.6 10*3/uL (ref 0.9–3.3)
LYMPH%: 27.4 % (ref 14.0–49.0)
MCH: 31 pg (ref 27.2–33.4)
MCHC: 33.2 g/dL (ref 32.0–36.0)
MCV: 93.4 fL (ref 79.3–98.0)
MONO#: 0.7 10*3/uL (ref 0.1–0.9)
MONO%: 12.3 % (ref 0.0–14.0)
NEUT#: 3.4 10*3/uL (ref 1.5–6.5)
NEUT%: 57.2 % (ref 39.0–75.0)
Platelets: 96 10*3/uL — ABNORMAL LOW (ref 140–400)
RBC: 4.8 10*6/uL (ref 4.20–5.82)
RDW: 13.9 % (ref 11.0–14.6)
WBC: 5.9 10*3/uL (ref 4.0–10.3)

## 2014-06-17 NOTE — Assessment & Plan Note (Signed)
This is likely due to ITP. The patient denies any recent signs or symptoms of bleeding such as spontaneous epistaxis, hematuria or hematochezia. The patient is on close observation. I will see him on a yearly basis.

## 2014-06-17 NOTE — Progress Notes (Signed)
[facility] FOLLOW-UP progress notes  Patient Care Team: Elizabeth Stewart Barnes, MD as PCP - General (Family Medicine) Elizabeth Stewart Barnes, MD (Family Medicine) Christina L Haverstock, MD as Referring Physician (Dermatology) Ni Gorsuch, MD as Consulting Physician (Hematology and Oncology)  CHIEF COMPLAINTS/PURPOSE OF VISIT:  Chronic thrombocytopenia  HISTORY OF PRESENTING ILLNESS:  Jimmy Robbins 80 y.o. male was transferred to my care after his prior physician has left.  I reviewed the patient's records extensive and collaborated the history with the patient. Summary of his history is as follows: He has chronic, intermittent thrombocytopenia; most likely from medication or chronic mild ITP.  Bone marrow biopsy in 08/2011 was negative for MDS.  The patient has no new symptoms apart from chronic arthritis of his knees. The patient denies any recent signs or symptoms of bleeding such as spontaneous epistaxis, hematuria or hematochezia.  MEDICAL HISTORY:  Past Medical History  Diagnosis Date  . Thrombocytopenia   . BPH (benign prostatic hyperplasia)   . HTN (hypertension)   . GERD (gastroesophageal reflux disease)   . Hyperlipidemia   . Nose ulceration 06/26/2012    SURGICAL HISTORY: History reviewed. No pertinent past surgical history.  SOCIAL HISTORY: History   Social History  . Marital Status: Married    Spouse Name: N/A    Number of Children: N/A  . Years of Education: N/A   Occupational History  . Not on file.   Social History Main Topics  . Smoking status: Never Smoker   . Smokeless tobacco: Never Used  . Alcohol Use: 4.2 oz/week    7 Glasses of wine per week  . Drug Use: Not on file  . Sexual Activity: Not on file   Other Topics Concern  . Not on file   Social History Narrative  . No narrative on file    FAMILY HISTORY: Family History  Problem Relation Age of Onset  . Cancer Neg Hx     ALLERGIES:  has No Known  Allergies.  MEDICATIONS:  Current Outpatient Prescriptions  Medication Sig Dispense Refill  . amLODipine (NORVASC) 5 MG tablet Take 5 mg by mouth daily.        . B Complex-C (B-COMPLEX WITH VITAMIN C) tablet Take 1 tablet by mouth daily.        . Cholecalciferol (VITAMIN D-3) 1000 UNITS CAPS Take 1 capsule by mouth.      . finasteride (PROSCAR) 5 MG tablet Take 5 mg by mouth daily.        . folic acid (FOLVITE) 1 MG tablet Take 1 mg by mouth daily.        . lovastatin (MEVACOR) 20 MG tablet Take 20 mg by mouth at bedtime.        . Multiple Vitamin (MULTIVITAMIN) tablet Take 1 tablet by mouth daily.        . multivitamin-lutein (OCUVITE-LUTEIN) CAPS Take 1 capsule by mouth daily.        . omeprazole (PRILOSEC) 20 MG capsule Take 20 mg by mouth daily.        . Tamsulosin HCl (FLOMAX) 0.4 MG CAPS Take 0.4 mg by mouth daily.         No current facility-administered medications for this visit.    REVIEW OF SYSTEMS:   Constitutional: Denies fevers, chills or abnormal night sweats Eyes: Denies blurriness of vision, double vision or watery eyes Ears, nose, mouth, throat, and face: Denies mucositis or sore throat Respiratory: Denies cough, dyspnea or wheezes Cardiovascular: Denies palpitation, chest   discomfort or lower extremity swelling Gastrointestinal:  Denies nausea, heartburn or change in bowel habits Skin: Denies abnormal skin rashes Lymphatics: Denies new lymphadenopathy or easy bruising Neurological:Denies numbness, tingling or new weaknesses Behavioral/Psych: Mood is stable, no new changes  All other systems were reviewed with the patient and are negative.  PHYSICAL EXAMINATION: ECOG PERFORMANCE STATUS: 0 - Asymptomatic  Filed Vitals:   06/17/14 0858  BP: 130/69  Pulse: 63  Temp: 97.6 F (36.4 C)  Resp: 18   Filed Weights   06/17/14 0858  Weight: 169 lb (76.658 kg)    GENERAL:alert, no distress and comfortable SKIN: skin color, texture, turgor are normal, no rashes or  significant lesions EYES: normal, conjunctiva are pink and non-injected, sclera clear PSYCH: alert & oriented x 3 with fluent speech NEURO: no focal motor/sensory deficits  LABORATORY DATA:  I have reviewed the data as listed Lab Results  Component Value Date   WBC 5.9 06/17/2014   HGB 14.9 06/17/2014   HCT 44.8 06/17/2014   MCV 93.4 06/17/2014   PLT 96* 06/17/2014    Recent Labs  06/25/13 1016 10/15/13 0931 02/11/14 0957  NA 140 141 141  K 4.4 4.6 4.4  CO2 23 24 22  GLUCOSE 92 82 88  BUN 15.4 15.4 16.8  CREATININE 1.1 1.1 1.0  CALCIUM 9.1 9.5 8.9  PROT 6.9 6.9 6.3*  ALBUMIN 4.0 4.2 4.0  AST 20 19 17  ALT 16 15 14  ALKPHOS 75 71 66  BILITOT 0.84 0.97 0.91    ASSESSMENT & PLAN:  Thrombocytopenia This is likely due to ITP. The patient denies any recent signs or symptoms of bleeding such as spontaneous epistaxis, hematuria or hematochezia. The patient is on close observation. I will see him on a yearly basis.    Orders Placed This Encounter  Procedures  . CBC with Differential    Standing Status: Future     Number of Occurrences: 1     Standing Expiration Date: 06/17/2015  . CBC with Differential    Standing Status: Future     Number of Occurrences:      Standing Expiration Date: 07/22/2015    All questions were answered. The patient knows to call the clinic with any problems, questions or concerns. I spent 15 minutes counseling the patient face to face. The total time spent in the appointment was 20 minutes and more than 50% was on counseling.     GORSUCH, NI, MD 06/17/2014 11:44 PM     

## 2014-06-18 ENCOUNTER — Telehealth: Payer: Self-pay | Admitting: Hematology and Oncology

## 2014-06-18 NOTE — Telephone Encounter (Signed)
gv and printed appts ched adna vs for pt for Aug...s.ed added tx. °

## 2014-06-18 NOTE — Telephone Encounter (Signed)
disregard last msg...incorrect pt.Jimmy Robbins..Jimmy Robbins

## 2014-06-19 ENCOUNTER — Ambulatory Visit: Payer: Medicare Other

## 2014-06-19 ENCOUNTER — Telehealth: Payer: Self-pay | Admitting: Hematology and Oncology

## 2014-06-19 NOTE — Telephone Encounter (Signed)
lvm for pt regarding to March 2016 appt....mailed pt appt sched/avs and letter °

## 2014-07-03 ENCOUNTER — Ambulatory Visit: Payer: Medicare Other | Admitting: Oncology

## 2015-02-10 ENCOUNTER — Encounter: Payer: Self-pay | Admitting: Hematology and Oncology

## 2015-02-10 ENCOUNTER — Other Ambulatory Visit (HOSPITAL_BASED_OUTPATIENT_CLINIC_OR_DEPARTMENT_OTHER): Payer: Medicare Other

## 2015-02-10 ENCOUNTER — Ambulatory Visit (HOSPITAL_BASED_OUTPATIENT_CLINIC_OR_DEPARTMENT_OTHER): Payer: Medicare Other | Admitting: Hematology and Oncology

## 2015-02-10 VITALS — BP 145/73 | HR 72 | Temp 97.4°F | Resp 17 | Ht 64.0 in | Wt 159.4 lb

## 2015-02-10 DIAGNOSIS — D696 Thrombocytopenia, unspecified: Secondary | ICD-10-CM | POA: Diagnosis not present

## 2015-02-10 LAB — CBC WITH DIFFERENTIAL/PLATELET
BASO%: 0.8 % (ref 0.0–2.0)
BASOS ABS: 0.1 10*3/uL (ref 0.0–0.1)
EOS ABS: 0.2 10*3/uL (ref 0.0–0.5)
EOS%: 2.9 % (ref 0.0–7.0)
HEMATOCRIT: 40.8 % (ref 38.4–49.9)
HEMOGLOBIN: 13.8 g/dL (ref 13.0–17.1)
LYMPH%: 25 % (ref 14.0–49.0)
MCH: 31.9 pg (ref 27.2–33.4)
MCHC: 33.8 g/dL (ref 32.0–36.0)
MCV: 94.2 fL (ref 79.3–98.0)
MONO#: 0.7 10*3/uL (ref 0.1–0.9)
MONO%: 11.2 % (ref 0.0–14.0)
NEUT#: 3.9 10*3/uL (ref 1.5–6.5)
NEUT%: 60.1 % (ref 39.0–75.0)
Platelets: 90 10*3/uL — ABNORMAL LOW (ref 140–400)
RBC: 4.33 10*6/uL (ref 4.20–5.82)
RDW: 14 % (ref 11.0–14.6)
WBC: 6.5 10*3/uL (ref 4.0–10.3)
lymph#: 1.6 10*3/uL (ref 0.9–3.3)

## 2015-02-10 NOTE — Progress Notes (Signed)
[place] NOTE  Gerrit Heck, MD SUMMARY OF HEMATOLOGIC HISTORY: I reviewed the patient's records extensive and collaborated the history with the patient. Summary of his history is as follows: He has chronic, intermittent thrombocytopenia; most likely from medication or chronic mild ITP.  Bone marrow biopsy in 08/2011 was negative for MDS.  INTERVAL HISTORY: Jimmy Robbins 81 y.o. male returns for further follow-up. He feels fine. Denies recent infection. The patient denies any recent signs or symptoms of bleeding such as spontaneous epistaxis, hematuria or hematochezia.   I have reviewed the past medical history, past surgical history, social history and family history with the patient and they are unchanged from previous note.  ALLERGIES:  has No Known Allergies.  MEDICATIONS:  Current Outpatient Prescriptions  Medication Sig Dispense Refill  . amLODipine (NORVASC) 5 MG tablet Take 5 mg by mouth daily.      . B Complex-C (B-COMPLEX WITH VITAMIN C) tablet Take 1 tablet by mouth daily.      . Cholecalciferol (VITAMIN D-3) 1000 UNITS CAPS Take 1 capsule by mouth.    . finasteride (PROSCAR) 5 MG tablet Take 5 mg by mouth daily.      . folic acid (FOLVITE) 1 MG tablet Take 1 mg by mouth daily.      Marland Kitchen lovastatin (MEVACOR) 20 MG tablet Take 20 mg by mouth at bedtime.      . Multiple Vitamin (MULTIVITAMIN) tablet Take 1 tablet by mouth daily.      . multivitamin-lutein (OCUVITE-LUTEIN) CAPS Take 1 capsule by mouth daily.      Marland Kitchen omeprazole (PRILOSEC) 20 MG capsule Take 20 mg by mouth daily.      . Tamsulosin HCl (FLOMAX) 0.4 MG CAPS Take 0.4 mg by mouth daily.       No current facility-administered medications for this visit.     REVIEW OF SYSTEMS:   Constitutional: Denies fevers, chills or night sweats Eyes: Denies blurriness of vision Ears, nose, mouth, throat, and face: Denies mucositis or sore throat Respiratory: Denies cough, dyspnea or  wheezes Cardiovascular: Denies palpitation, chest discomfort or lower extremity swelling Gastrointestinal:  Denies nausea, heartburn or change in bowel habits Skin: Denies abnormal skin rashes Lymphatics: Denies new lymphadenopathy or easy bruising Neurological:Denies numbness, tingling or new weaknesses Behavioral/Psych: Mood is stable, no new changes  All other systems were reviewed with the patient and are negative.  PHYSICAL EXAMINATION: ECOG PERFORMANCE STATUS: 0 - Asymptomatic  Filed Vitals:   02/10/15 1025  BP: 145/73  Pulse: 72  Temp: 97.4 F (36.3 C)  Resp: 17   Filed Weights   02/10/15 1025  Weight: 159 lb 6.4 oz (72.303 kg)    GENERAL:alert, no distress and comfortable SKIN: skin color, texture, turgor are normal, no rashes or significant lesions EYES: normal, Conjunctiva are pink and non-injected, sclera clear Musculoskeletal:no cyanosis of digits and no clubbing  NEURO: alert & oriented x 3 with fluent speech, no focal motor/sensory deficits  LABORATORY DATA:  I have reviewed the data as listed Results for orders placed or performed in visit on 02/10/15 (from the past 48 hour(s))  CBC with Differential     Status: Abnormal   Collection Time: 02/10/15 10:12 AM  Result Value Ref Range   WBC 6.5 4.0 - 10.3 10e3/uL   NEUT# 3.9 1.5 - 6.5 10e3/uL   HGB 13.8 13.0 - 17.1 g/dL   HCT 40.8 38.4 - 49.9 %   Platelets 90 (L) 140 - 400 10e3/uL  MCV 94.2 79.3 - 98.0 fL   MCH 31.9 27.2 - 33.4 pg   MCHC 33.8 32.0 - 36.0 g/dL   RBC 4.33 4.20 - 5.82 10e6/uL   RDW 14.0 11.0 - 14.6 %   lymph# 1.6 0.9 - 3.3 10e3/uL   MONO# 0.7 0.1 - 0.9 10e3/uL   Eosinophils Absolute 0.2 0.0 - 0.5 10e3/uL   Basophils Absolute 0.1 0.0 - 0.1 10e3/uL   NEUT% 60.1 39.0 - 75.0 %   LYMPH% 25.0 14.0 - 49.0 %   MONO% 11.2 0.0 - 14.0 %   EOS% 2.9 0.0 - 7.0 %   BASO% 0.8 0.0 - 2.0 %    Lab Results  Component Value Date   WBC 6.5 02/10/2015   HGB 13.8 02/10/2015   HCT 40.8 02/10/2015   MCV  94.2 02/10/2015   PLT 90* 02/10/2015   ASSESSMENT & PLAN:  Thrombocytopenia This is likely due to ITP. The patient denies any recent signs or symptoms of bleeding such as spontaneous epistaxis, hematuria or hematochezia. The patient is on close observation. Since his platelet has been stable, I recommend follow-up with PCP with annual CBC monitoring only. If his platelet count started to drift closer to 50,000, I'll see the patient back. He is comfortable with the plan.    All questions were answered. The patient knows to call the clinic with any problems, questions or concerns. No barriers to learning was detected.  I spent 15 minutes counseling the patient face to face. The total time spent in the appointment was 20 minutes and more than 50% was on counseling.     [facility], Dereonna Lensing, MD 3/30/201611:09 AM

## 2015-02-10 NOTE — Assessment & Plan Note (Signed)
This is likely due to ITP. The patient denies any recent signs or symptoms of bleeding such as spontaneous epistaxis, hematuria or hematochezia. The patient is on close observation. Since his platelet has been stable, I recommend follow-up with PCP with annual CBC monitoring only. If his platelet count started to drift closer to 50,000, I'll see the patient back. He is comfortable with the plan.

## 2015-07-30 ENCOUNTER — Encounter (HOSPITAL_COMMUNITY): Payer: Self-pay | Admitting: *Deleted

## 2015-07-30 ENCOUNTER — Emergency Department (HOSPITAL_COMMUNITY): Payer: No Typology Code available for payment source

## 2015-07-30 ENCOUNTER — Emergency Department (HOSPITAL_COMMUNITY)
Admission: EM | Admit: 2015-07-30 | Discharge: 2015-07-30 | Disposition: A | Discharge status: Home / Self Care | Payer: No Typology Code available for payment source | Attending: Emergency Medicine | Admitting: Emergency Medicine

## 2015-07-30 DIAGNOSIS — Z862 Personal history of diseases of the blood and blood-forming organs and certain disorders involving the immune mechanism: Secondary | ICD-10-CM | POA: Diagnosis not present

## 2015-07-30 DIAGNOSIS — K219 Gastro-esophageal reflux disease without esophagitis: Secondary | ICD-10-CM | POA: Insufficient documentation

## 2015-07-30 DIAGNOSIS — S0990XA Unspecified injury of head, initial encounter: Secondary | ICD-10-CM | POA: Diagnosis present

## 2015-07-30 DIAGNOSIS — Y92481 Parking lot as the place of occurrence of the external cause: Secondary | ICD-10-CM | POA: Diagnosis not present

## 2015-07-30 DIAGNOSIS — S0083XA Contusion of other part of head, initial encounter: Secondary | ICD-10-CM | POA: Diagnosis not present

## 2015-07-30 DIAGNOSIS — Z79899 Other long term (current) drug therapy: Secondary | ICD-10-CM | POA: Insufficient documentation

## 2015-07-30 DIAGNOSIS — S0081XA Abrasion of other part of head, initial encounter: Secondary | ICD-10-CM | POA: Diagnosis not present

## 2015-07-30 DIAGNOSIS — E785 Hyperlipidemia, unspecified: Secondary | ICD-10-CM | POA: Diagnosis not present

## 2015-07-30 DIAGNOSIS — Y998 Other external cause status: Secondary | ICD-10-CM | POA: Diagnosis not present

## 2015-07-30 DIAGNOSIS — S5002XA Contusion of left elbow, initial encounter: Secondary | ICD-10-CM

## 2015-07-30 DIAGNOSIS — Y9389 Activity, other specified: Secondary | ICD-10-CM | POA: Insufficient documentation

## 2015-07-30 DIAGNOSIS — N400 Enlarged prostate without lower urinary tract symptoms: Secondary | ICD-10-CM | POA: Insufficient documentation

## 2015-07-30 LAB — CBC WITH DIFFERENTIAL/PLATELET
BASOS PCT: 0 %
Basophils Absolute: 0 10*3/uL (ref 0.0–0.1)
EOS ABS: 0.1 10*3/uL (ref 0.0–0.7)
EOS PCT: 1 %
HEMATOCRIT: 39.7 % (ref 39.0–52.0)
HEMOGLOBIN: 13.2 g/dL (ref 13.0–17.0)
LYMPHS PCT: 14 %
Lymphs Abs: 0.9 10*3/uL (ref 0.7–4.0)
MCH: 31 pg (ref 26.0–34.0)
MCHC: 33.2 g/dL (ref 30.0–36.0)
MCV: 93.2 fL (ref 78.0–100.0)
Monocytes Absolute: 0.6 10*3/uL (ref 0.1–1.0)
Monocytes Relative: 10 %
NEUTROS ABS: 4.7 10*3/uL (ref 1.7–7.7)
Neutrophils Relative %: 75 %
Platelets: 95 10*3/uL — ABNORMAL LOW (ref 150–400)
RBC: 4.26 MIL/uL (ref 4.22–5.81)
RDW: 14.1 % (ref 11.5–15.5)
WBC: 6.3 10*3/uL (ref 4.0–10.5)
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
## 2015-07-30 NOTE — ED Notes (Signed)
Delay explained to pt. NAD.  Pt currently in xray
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
## 2017-11-13 DEATH — deceased
